# Patient Record
Sex: Male | Born: 1935 | Race: White | Hispanic: No | Marital: Married | Smoking: Former smoker
Health system: Southern US, Community
[De-identification: ages and names within clinical notes are randomized; demographics above are authoritative.]

## PROBLEM LIST (undated history)

## (undated) DIAGNOSIS — E119 Type 2 diabetes mellitus without complications: Secondary | ICD-10-CM

## (undated) DIAGNOSIS — I4891 Unspecified atrial fibrillation: Secondary | ICD-10-CM

## (undated) DIAGNOSIS — I739 Peripheral vascular disease, unspecified: Secondary | ICD-10-CM

## (undated) HISTORY — DX: Type 2 diabetes mellitus without complications: E11.9

## (undated) HISTORY — DX: Unspecified atrial fibrillation: I48.91

## (undated) HISTORY — PX: PACEMAKER INSERTION: SHX728

## (undated) HISTORY — DX: Peripheral vascular disease, unspecified: I73.9

---

## 2019-12-05 ENCOUNTER — Encounter (HOSPITAL_BASED_OUTPATIENT_CLINIC_OR_DEPARTMENT_OTHER): Payer: Self-pay | Admitting: *Deleted

## 2019-12-05 ENCOUNTER — Other Ambulatory Visit: Payer: Self-pay

## 2019-12-05 ENCOUNTER — Emergency Department (HOSPITAL_BASED_OUTPATIENT_CLINIC_OR_DEPARTMENT_OTHER): Payer: Medicare Other

## 2019-12-05 ENCOUNTER — Emergency Department (HOSPITAL_BASED_OUTPATIENT_CLINIC_OR_DEPARTMENT_OTHER)
Admission: EM | Admit: 2019-12-05 | Discharge: 2019-12-05 | Disposition: A | Discharge status: Home / Self Care | Payer: Medicare Other | Attending: Emergency Medicine | Admitting: Emergency Medicine

## 2019-12-05 DIAGNOSIS — R413 Other amnesia: Secondary | ICD-10-CM | POA: Diagnosis not present

## 2019-12-05 DIAGNOSIS — E119 Type 2 diabetes mellitus without complications: Secondary | ICD-10-CM | POA: Insufficient documentation

## 2019-12-05 DIAGNOSIS — Z87891 Personal history of nicotine dependence: Secondary | ICD-10-CM | POA: Insufficient documentation

## 2019-12-05 DIAGNOSIS — R4182 Altered mental status, unspecified: Secondary | ICD-10-CM | POA: Insufficient documentation

## 2019-12-05 DIAGNOSIS — Z950 Presence of cardiac pacemaker: Secondary | ICD-10-CM | POA: Insufficient documentation

## 2019-12-05 LAB — CBC WITH DIFFERENTIAL/PLATELET
Abs Immature Granulocytes: 0.01 10*3/uL (ref 0.00–0.07)
Basophils Absolute: 0 10*3/uL (ref 0.0–0.1)
Basophils Relative: 1 %
Eosinophils Absolute: 0.4 10*3/uL (ref 0.0–0.5)
Eosinophils Relative: 7 %
HCT: 33.6 % — ABNORMAL LOW (ref 39.0–52.0)
Hemoglobin: 11.6 g/dL — ABNORMAL LOW (ref 13.0–17.0)
Immature Granulocytes: 0 %
Lymphocytes Relative: 29 %
Lymphs Abs: 1.5 10*3/uL (ref 0.7–4.0)
MCH: 32 pg (ref 26.0–34.0)
MCHC: 34.5 g/dL (ref 30.0–36.0)
MCV: 92.6 fL (ref 80.0–100.0)
Monocytes Absolute: 0.6 10*3/uL (ref 0.1–1.0)
Monocytes Relative: 11 %
Neutro Abs: 2.8 10*3/uL (ref 1.7–7.7)
Neutrophils Relative %: 52 %
Platelets: 178 10*3/uL (ref 150–400)
RBC: 3.63 MIL/uL — ABNORMAL LOW (ref 4.22–5.81)
RDW: 13 % (ref 11.5–15.5)
WBC: 5.3 10*3/uL (ref 4.0–10.5)
nRBC: 0 % (ref 0.0–0.2)

## 2019-12-05 LAB — COMPREHENSIVE METABOLIC PANEL
ALT: 11 U/L (ref 0–44)
AST: 16 U/L (ref 15–41)
Albumin: 3.8 g/dL (ref 3.5–5.0)
Alkaline Phosphatase: 46 U/L (ref 38–126)
Anion gap: 10 (ref 5–15)
BUN: 30 mg/dL — ABNORMAL HIGH (ref 8–23)
CO2: 23 mmol/L (ref 22–32)
Calcium: 8.7 mg/dL — ABNORMAL LOW (ref 8.9–10.3)
Chloride: 106 mmol/L (ref 98–111)
Creatinine, Ser: 1.55 mg/dL — ABNORMAL HIGH (ref 0.61–1.24)
GFR calc Af Amer: 44 mL/min — ABNORMAL LOW (ref >60)
GFR calc non Af Amer: 38 mL/min — ABNORMAL LOW (ref >60)
Glucose, Bld: 226 mg/dL — ABNORMAL HIGH (ref 70–99)
Potassium: 4.2 mmol/L (ref 3.5–5.1)
Sodium: 139 mmol/L (ref 135–145)
Total Bilirubin: 0.5 mg/dL (ref 0.3–1.2)
Total Protein: 6.7 g/dL (ref 6.5–8.1)

## 2019-12-05 LAB — URINALYSIS, MICROSCOPIC (REFLEX)

## 2019-12-05 LAB — URINALYSIS, ROUTINE W REFLEX MICROSCOPIC
Bilirubin Urine: NEGATIVE
Glucose, UA: NEGATIVE mg/dL
Hgb urine dipstick: NEGATIVE
Ketones, ur: NEGATIVE mg/dL
Leukocytes,Ua: NEGATIVE
Nitrite: NEGATIVE
Protein, ur: 30 mg/dL — AB
Specific Gravity, Urine: 1.025 (ref 1.005–1.030)
pH: 5.5 (ref 5.0–8.0)

## 2019-12-05 NOTE — ED Notes (Signed)
Pt. Daughter here with Pt. Due to Pt. Confusion.  Pt. Confusion is not new per Daughter.Ryan Mclaughlin.  Pt. Is here staying with her x one month due to pace maker battery change.  Pt. Has not been himself since the battery of pacemaker was changed.  Pt. Is getting progressively worse up day and night and very confused with what is going on.    Pt. Is eating per daughter along with some incontinence at night.  Pt. Reports the Pt. Will tell you if he needs to urinate and go but still issues noted.    Main reason here today is confusion ... all during night he was up wondering the house trying to get out of the house and unable to leave in the car due to pushing buttons.

## 2019-12-05 NOTE — ED Triage Notes (Signed)
Daughter states increased confusion x 1 month.

## 2019-12-05 NOTE — ED Provider Notes (Signed)
[facility] EMERGENCY DEPARTMENT Provider Note  CSN: [id number] Arrival date & time: 12/05/19 1803    History Chief Complaint  Patient presents with  . Altered Mental Status    HPI  Ryan Mclaughlin is a 86 y.o. male with history of afib and DM brought to the ED by his daughter. The patient has been living in [place] for many years. About a month ago he had a pacemaker battery change by his Cardiologist at [facility] and afterwards he and his wife came to [place] to live with his daughter. She reports he has progressively worsening memory problems, gets angry about things like not being allowed to go for walks or drive his car. She reports he has some good days and bad but the last 2-3 days have been particularly bad. The patient has some gait and prostate problems at baseline that he describes to me, but denies any particular acute complaints today. The daughter reports she and her sister are planning to move the patient and his wife back to their home in [place] and take turns caring for them at some point next week. She is also going to try to get the patient in to see his PCP next week as well.    Past Medical History:  Diagnosis Date  . Atrial fibrillation (HCC)   . Diabetes mellitus without complication (HCC)   . PVD (peripheral vascular disease) (HCC)     Past Surgical History:  Procedure Laterality Date  . PACEMAKER INSERTION      History reviewed. No pertinent family history.  Social History   Tobacco Use  . Smoking status: Former Games developer  . Smokeless tobacco: Never Used  Substance Use Topics  . Alcohol use: Not Currently  . Drug use: Not Currently     Home Medications Prior to Admission medications   Not on File     Allergies    Ticagrelor, Clopidogrel, Dabigatran, Metoprolol, and Warfarin   Review of Systems   Review of Systems A comprehensive review of systems was completed and negative except as noted in HPI.    Physical Exam BP (!)  162/87 (BP Location: Right Arm)   Pulse 85   Temp 97.9 F (36.6 C) (Oral)   Resp 19   Ht 6\' 1"  (1.854 m)   Wt 75.3 kg   SpO2 100%   BMI 21.90 kg/m   Physical Exam Vitals and nursing note reviewed.  Constitutional:      Appearance: Normal appearance.  HENT:     Head: Normocephalic and atraumatic.     Nose: Nose normal.     Mouth/Throat:     Mouth: Mucous membranes are moist.  Eyes:     Extraocular Movements: Extraocular movements intact.     Conjunctiva/sclera: Conjunctivae normal.  Cardiovascular:     Rate and Rhythm: Normal rate.  Pulmonary:     Effort: Pulmonary effort is normal.     Breath sounds: Normal breath sounds.  Abdominal:     General: Abdomen is flat.     Palpations: Abdomen is soft.     Tenderness: There is no abdominal tenderness.  Musculoskeletal:        General: No swelling. Normal range of motion.     Cervical back: Neck supple.  Skin:    General: Skin is warm and dry.  Neurological:     General: No focal deficit present.     Mental Status: He is alert.     Cranial Nerves: No cranial nerve deficit.  Sensory: No sensory deficit.     Motor: No weakness.  Psychiatric:        Mood and Affect: Mood normal.      ED Results / Procedures / Treatments   Labs (all labs ordered are listed, but only abnormal results are displayed) Labs Reviewed  URINALYSIS, ROUTINE W REFLEX MICROSCOPIC - Abnormal; Notable for the following components:      Result Value   Protein, ur 30 (*)    All other components within normal limits  CBC WITH DIFFERENTIAL/PLATELET - Abnormal; Notable for the following components:   RBC 3.63 (*)    Hemoglobin 11.6 (*)    HCT 33.6 (*)    All other components within normal limits  COMPREHENSIVE METABOLIC PANEL - Abnormal; Notable for the following components:   Glucose, Bld 226 (*)    BUN 30 (*)    Creatinine, Ser 1.55 (*)    Calcium 8.7 (*)    GFR calc non Af Amer 38 (*)    GFR calc Af Amer 44 (*)    All other components  within normal limits  URINALYSIS, MICROSCOPIC (REFLEX) - Abnormal; Notable for the following components:   Bacteria, UA FEW (*)    All other components within normal limits    EKG EKG Interpretation  Date/Time:  Friday December 05 2019 18:29:30 EDT Ventricular Rate:  84 PR Interval:    QRS Duration: 143 QT Interval:  406 QTC Calculation: 480 R Axis:   -106 Text Interpretation: Atrial fibrillation Right bundle branch block No old tracing to compare Confirmed by Susy Frizzle [phone]) on 12/05/2019 6:42:48 PM    Radiology DG Chest 2 View  Result Date: 12/05/2019 CLINICAL DATA:  Altered mental status, no fever or cough. EXAM: CHEST - 2 VIEW COMPARISON:  None. FINDINGS: The heart size and mediastinal contours are within normal limits. Aortic arch calcifications. Atrial septal defect Amplatzer repair. Single lead cardiac pacemaker with tip overlying the right ventricle. Biapical pleural/pulmonary scarring. No focal consolidation. No pulmonary edema. No pleural effusion. No pneumothorax. No acute osseous abnormality. IMPRESSION: No active cardiopulmonary disease. Electronically Signed   By: Tish Frederickson M.D.   On: 12/05/2019 20:43   CT Head Wo Contrast  Result Date: 12/05/2019 CLINICAL DATA:  Delirium. EXAM: CT HEAD WITHOUT CONTRAST TECHNIQUE: Contiguous axial images were obtained from the base of the skull through the vertex without intravenous contrast. COMPARISON:  None. FINDINGS: Brain: No hemorrhage. No extraaxial collection.No midline shift. There is atrophy.The basal cisterns are unremarkable. Patchy and confluent areas of decreased attenuation are noted throughout the deep and periventricular white matter of the cerebral hemispheres bilaterally, compatible with chronic microvascular ischemic disease. The brainstem is unremarkable. The cerebellum is unremarkable. The sella is unremarkable. Vascular: No hyperdense vessel or unexpected calcification. Skull: The calvarium is  unremarkable. The skull base is unremarkable. The visualized upper cervical spine is unremarkable. Sinuses/Orbits: The visualized orbits are unremarkable. There is opacification and mucosal thickening of the right maxillary sinus. The remaining paranasal sinuses are essentially clear. The mastoid air cells are clear. Other: The visualized parotid gland is unremarkable. There is no scalp soft tissue swelling. IMPRESSION: 1. No acute intracranial abnormality. 2. Advanced atrophy and chronic microvascular ischemic disease. 3. Right maxillary sinus disease. Electronically Signed   By: Katherine Mantle M.D.   On: 12/05/2019 19:44    Procedures Procedures  Medications Ordered in the ED Medications - No data to display   MDM Rules/Calculators/A&P MDM Patient with progressive memory problems with behavioral disturbance. No  formal diagnosis of dementia. Worse in the last few days. Will assess for any acute process which may have exacerbated his symptoms.  ED Course  I have reviewed the triage vital signs and the nursing notes.  Pertinent labs & imaging results that were available during my care of the patient were reviewed by me and considered in my medical decision making (see chart for details).  Clinical Course as of Dec 04 2316  Fri Dec 05, 2019  2035 CBC with mild anemia. CMP with mildly elevated Cr unknown baseline.    [CS]  2048 CT head and CXR neg for acute process.    [CS]  2117 UA neg for infection. No acute or emergent process identified. Suspect some degree of dementia, discussed this with patient and daughter at bedside. No indication for admission, but I agree with their plan to return him to familiar setting and close PCP follow up.    [CS]    Clinical Course User Index [CS] Pollyann Savoy, MD    Final Clinical Impression(s) / ED Diagnoses Final diagnoses:  Memory changes    Rx / DC Orders ED Discharge Orders    None       Pollyann Savoy, MD 12/05/19  2318

## 2021-01-11 DEATH — deceased

## 2022-02-15 IMAGING — DX DG CHEST 2V
3 series · 3 of 3 positions shown · non-contrast
Comparison: None.

CLINICAL DATA: Altered mental status, no fever or cough.

EXAM:
CHEST - 2 VIEW

[chest lat (1 of 2)]
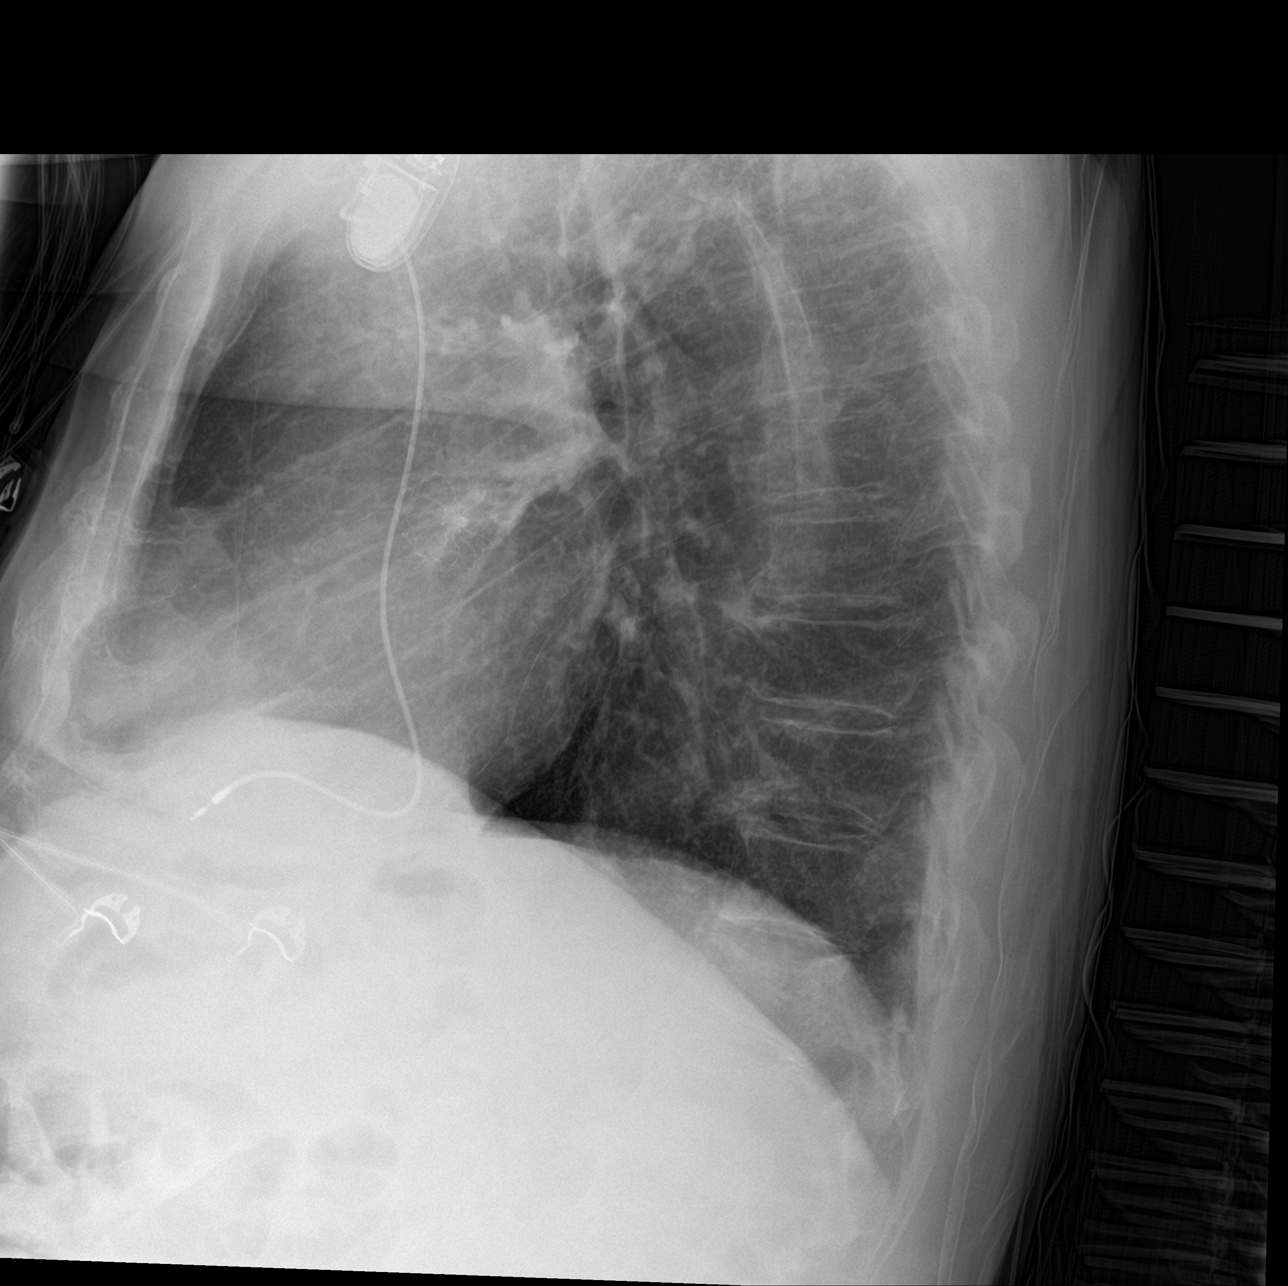

[chest ap strecther]
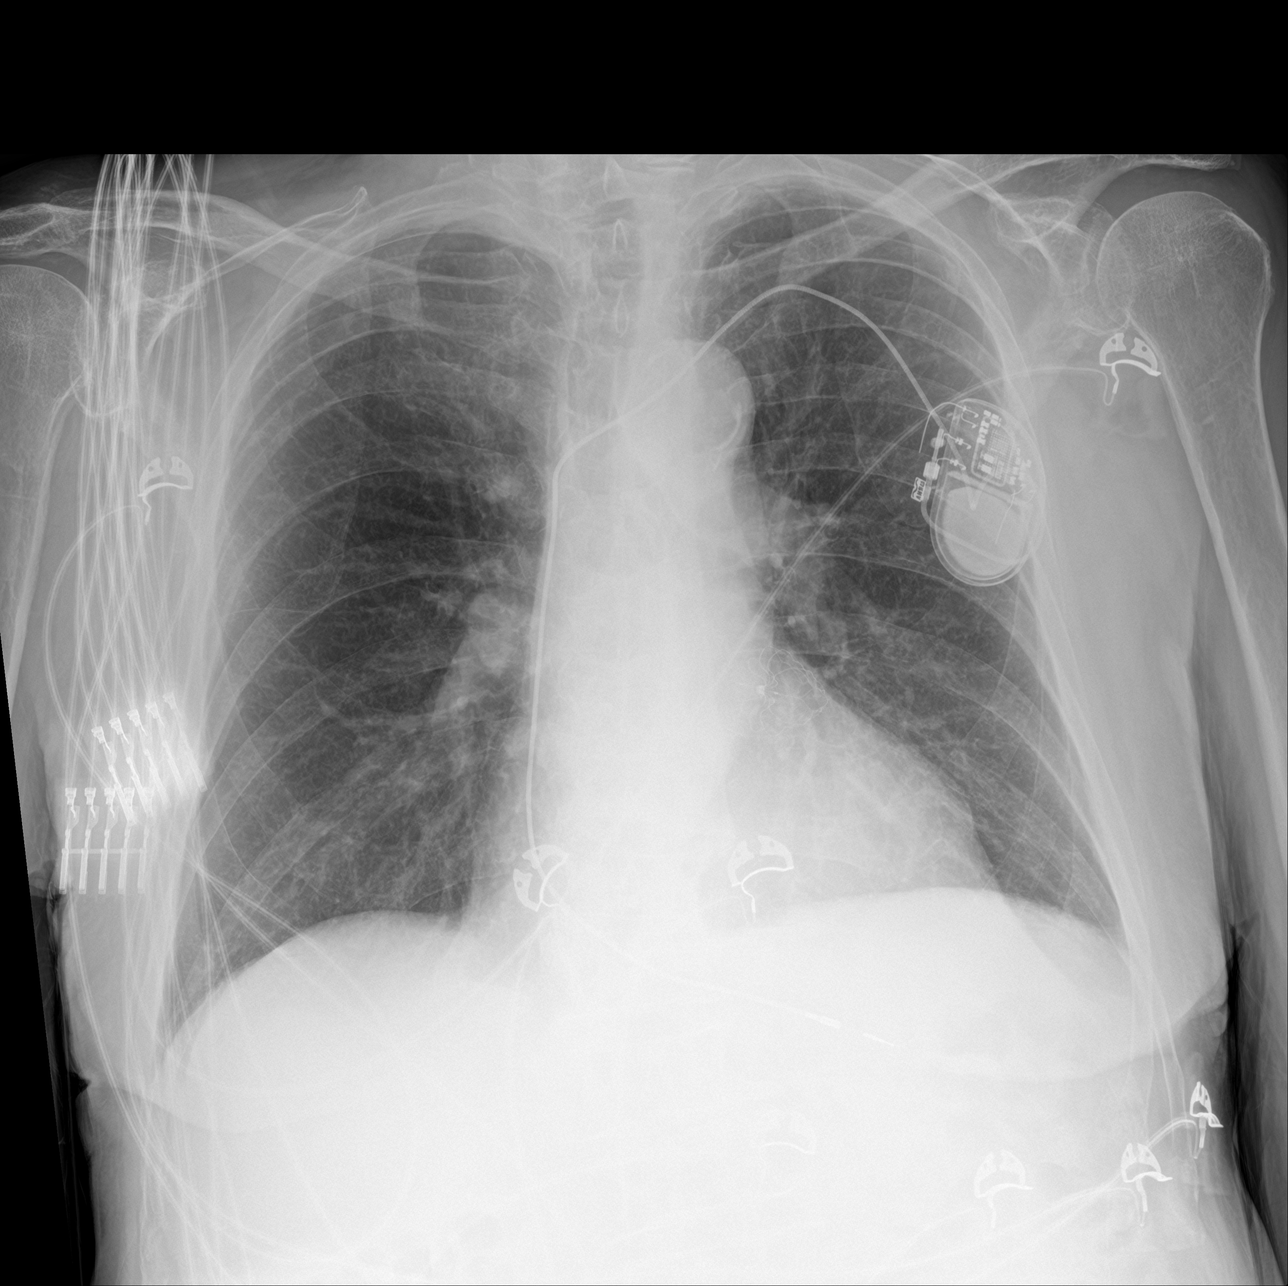

[chest lat (2 of 2)]
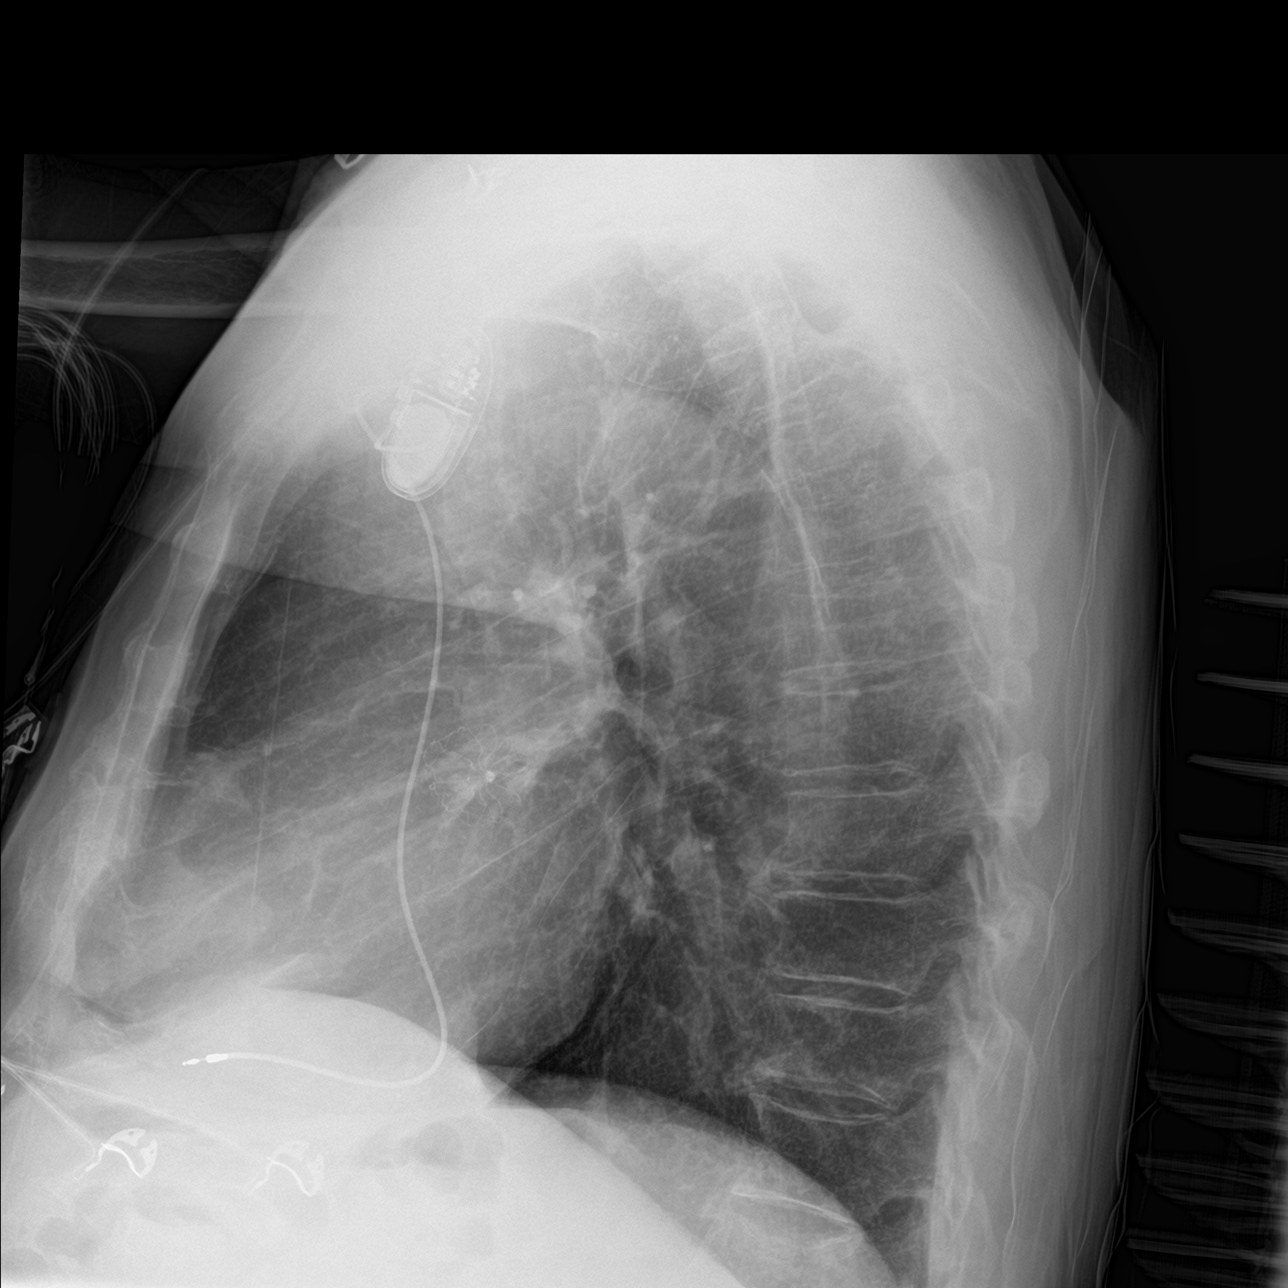

[3 of 3 positions shown; findings below may reference images not displayed]

FINDINGS: The heart size and mediastinal contours are within normal limits.
Aortic arch calcifications. Atrial septal defect Amplatzer repair.
Single lead cardiac pacemaker with tip overlying the right
ventricle.

Biapical pleural/pulmonary scarring. No focal consolidation. No
pulmonary edema. No pleural effusion. No pneumothorax.

No acute osseous abnormality.
IMPRESSION: No active cardiopulmonary disease.

## 2022-02-15 IMAGING — CT CT HEAD W/O CM
3 series · 15 of 47 positions shown, 18 images · non-contrast
Comparison: None.

CLINICAL DATA: Delirium.

EXAM:
CT HEAD WITHOUT CONTRAST
TECHNIQUE: Contiguous axial images were obtained from the base of the skull
through the vertex without intravenous contrast.

[Series 2: head wo · axial · 0.47mm/px · z∈[-60,+70]mm · 9 of 32 slices shown, 12 images]
[im 3/32  brain]
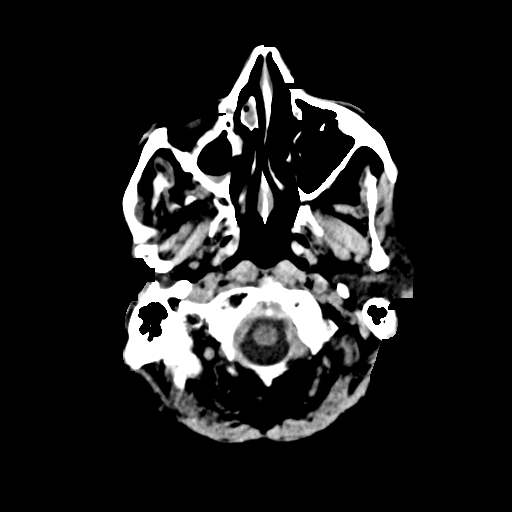
[im 3/32  bone]
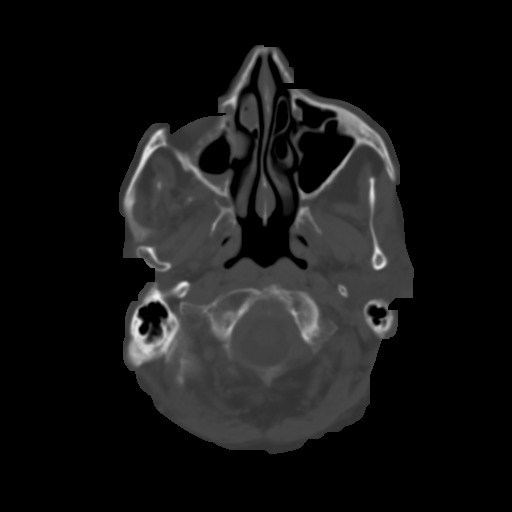
[im 6/32  brain]
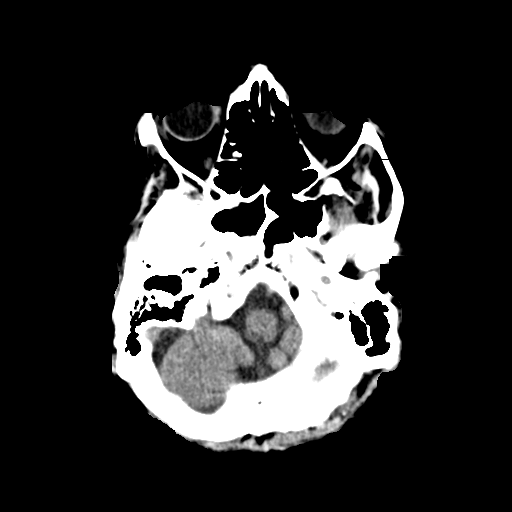
[im 9/32  brain]
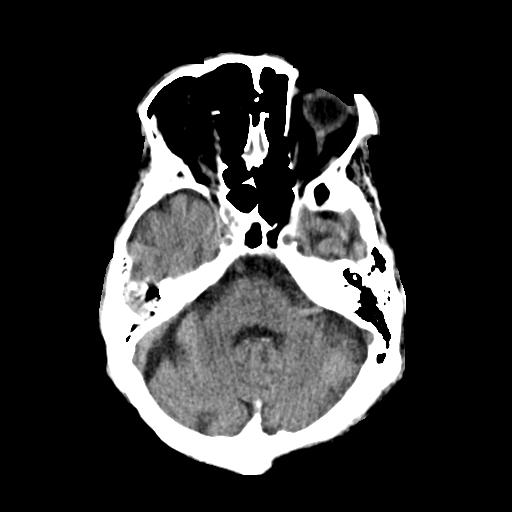
[im 12/32  brain]
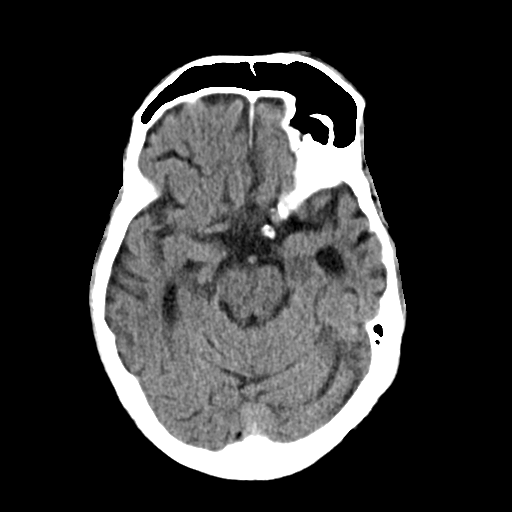
[im 17/32  brain]
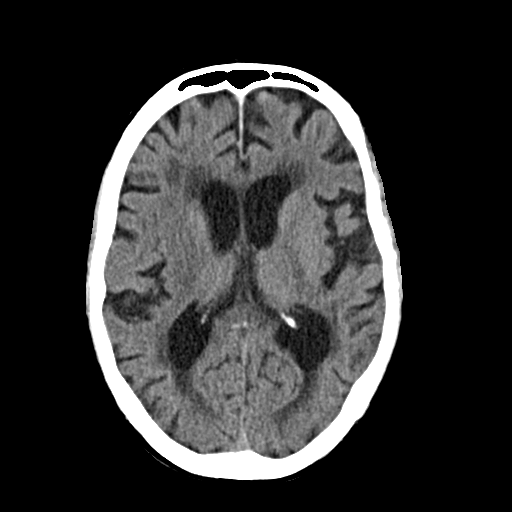
[im 17/32  bone]
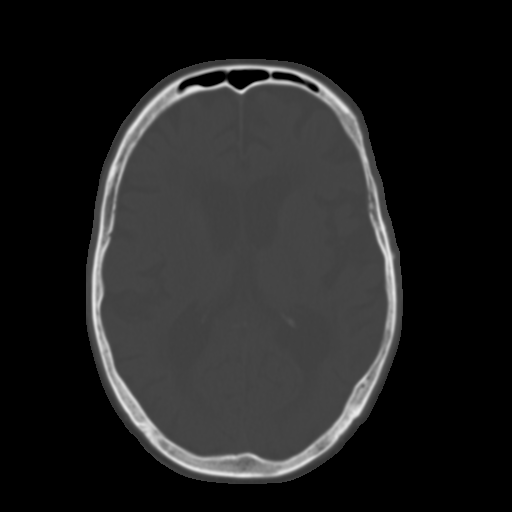
[im 20/32  brain]
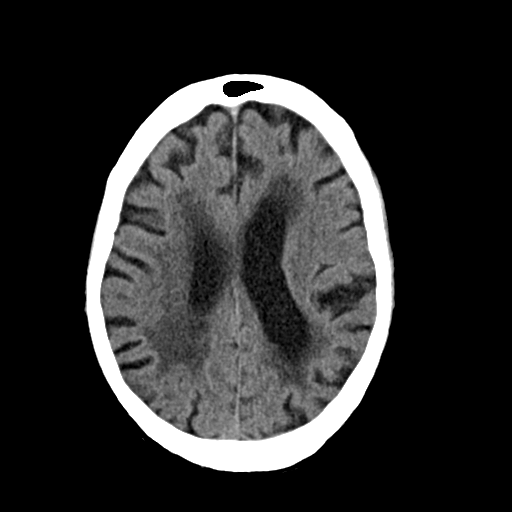
[im 23/32  brain]
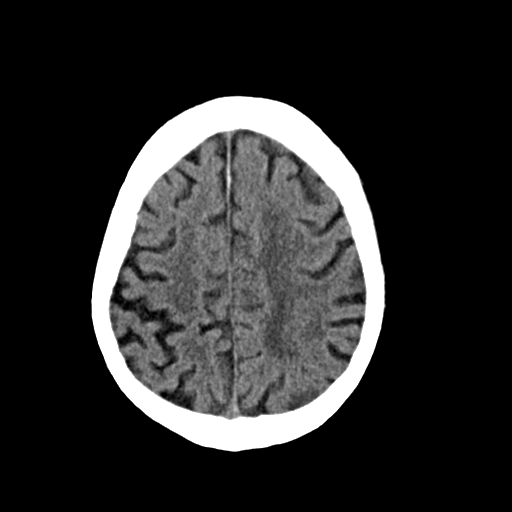
[im 26/32  brain]
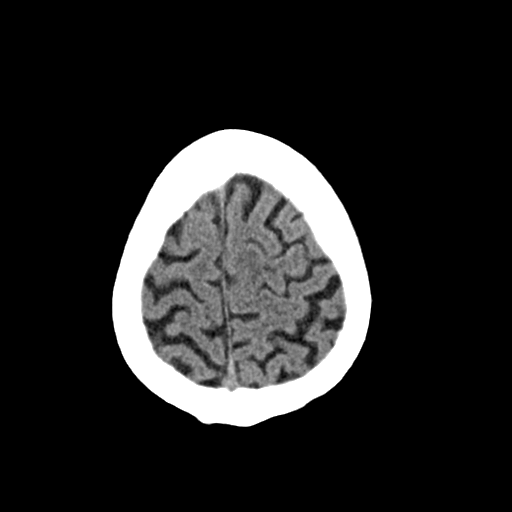
[im 29/32  brain]
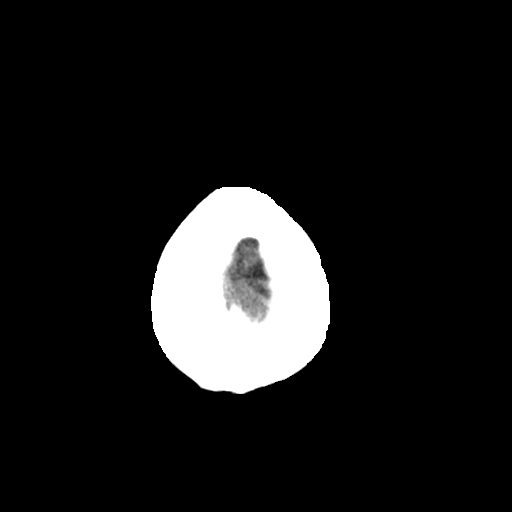
[im 29/32  bone]
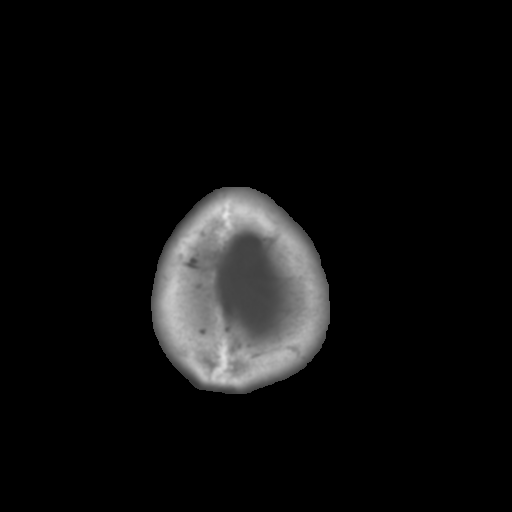

[Series 4: coronal soft · coronal · 0.33mm/px · 3 of 75 slices shown]
[im 25/75  brain]
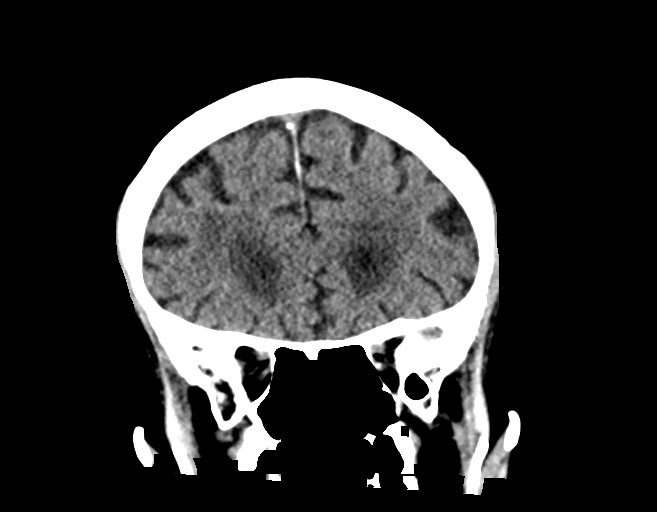
[im 33/75  brain]
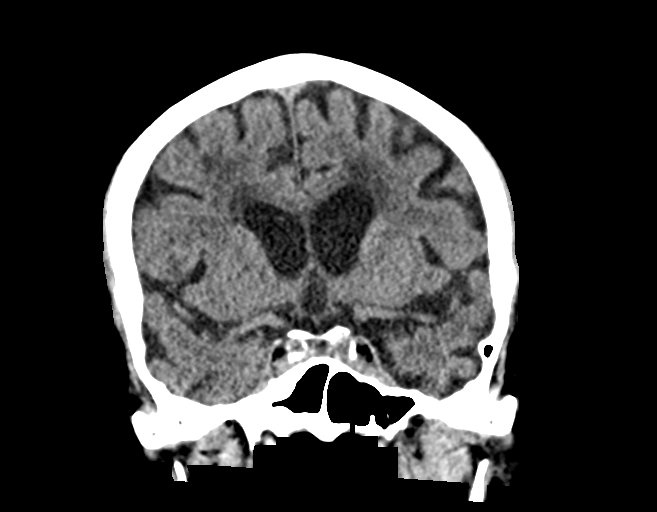
[im 42/75  brain]
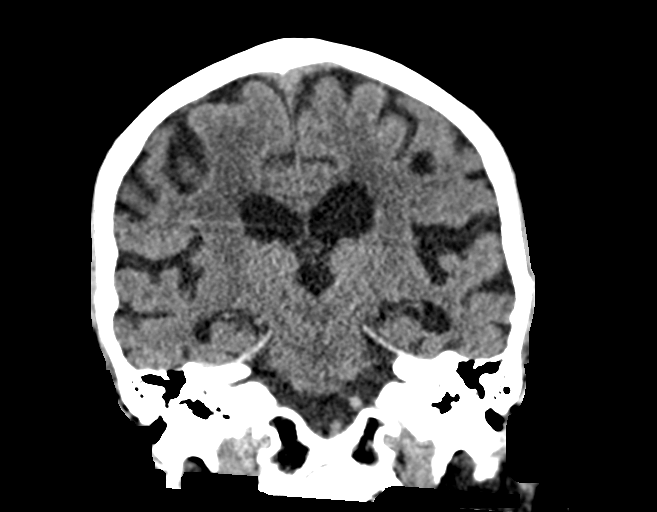

[Series 5: sag soft · sagittal · 0.33mm/px · 3 of 58 slices shown]
[im 20/58  brain]
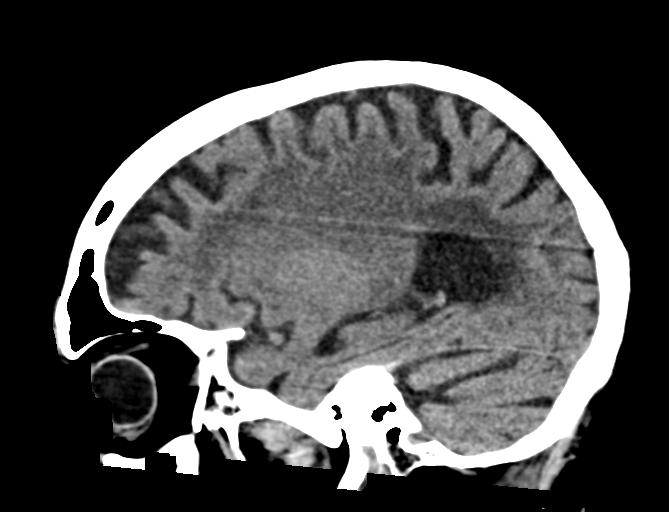
[im 29/58  brain]
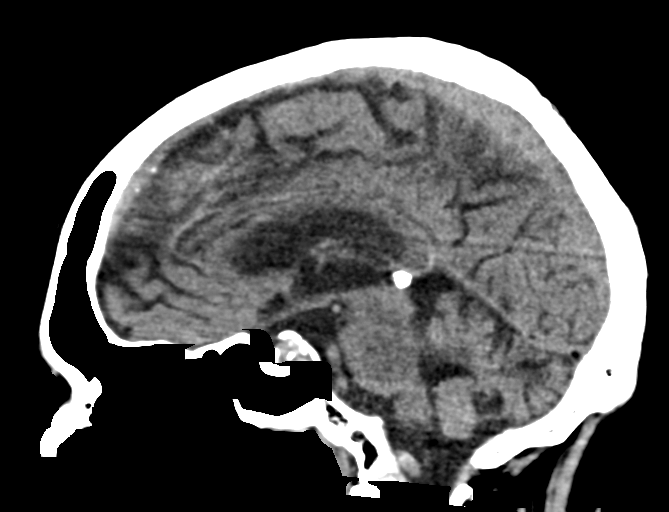
[im 39/58  brain]
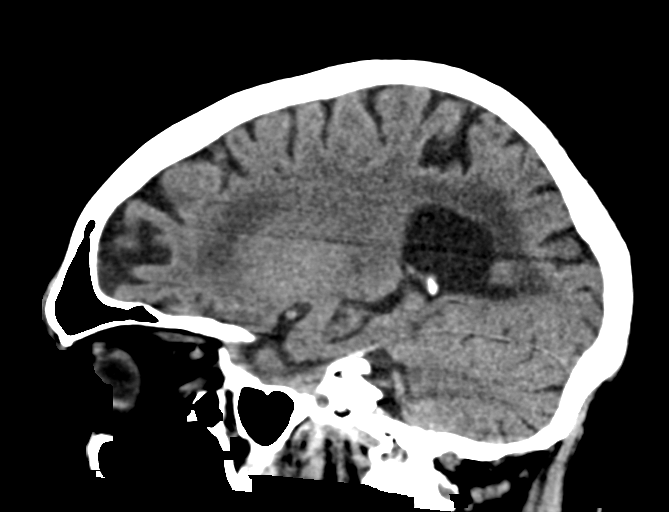

[15 of 47 positions shown; findings below may reference images not displayed]

FINDINGS: Brain: No hemorrhage. No extraaxial collection.No midline shift.
There is atrophy.The basal cisterns are unremarkable. Patchy and
confluent areas of decreased attenuation are noted throughout the
deep and periventricular white matter of the cerebral hemispheres
bilaterally, compatible with chronic microvascular ischemic disease.
The brainstem is unremarkable. The cerebellum is unremarkable. The
sella is unremarkable.

Vascular: No hyperdense vessel or unexpected calcification.

Skull: The calvarium is unremarkable. The skull base is
unremarkable. The visualized upper cervical spine is unremarkable.

Sinuses/Orbits: The visualized orbits are unremarkable. There is
opacification and mucosal thickening of the right maxillary sinus.
The remaining paranasal sinuses are essentially clear. The mastoid
air cells are clear.

Other: The visualized parotid gland is unremarkable. There is no
scalp soft tissue swelling.
IMPRESSION: 1. No acute intracranial abnormality.
2. Advanced atrophy and chronic microvascular ischemic disease.
3. Right maxillary sinus disease.
